# Patient Record
Sex: Male | Born: 1975 | Race: White | Hispanic: No | Marital: Single | Smoking: Current every day smoker
Health system: Southern US, Community
[De-identification: ages and names within clinical notes are randomized; demographics above are authoritative.]

---

## 2014-03-12 ENCOUNTER — Emergency Department (HOSPITAL_COMMUNITY)
Admission: EM | Admit: 2014-03-12 | Discharge: 2014-03-12 | Disposition: A | Discharge status: Home / Self Care | Payer: Self-pay | Attending: Emergency Medicine | Admitting: Emergency Medicine

## 2014-03-12 ENCOUNTER — Emergency Department (HOSPITAL_COMMUNITY): Payer: Self-pay

## 2014-03-12 ENCOUNTER — Encounter (HOSPITAL_COMMUNITY): Payer: Self-pay | Admitting: Emergency Medicine

## 2014-03-12 DIAGNOSIS — IMO0002 Reserved for concepts with insufficient information to code with codable children: Secondary | ICD-10-CM | POA: Insufficient documentation

## 2014-03-12 DIAGNOSIS — F17200 Nicotine dependence, unspecified, uncomplicated: Secondary | ICD-10-CM | POA: Insufficient documentation

## 2014-03-12 DIAGNOSIS — M7040 Prepatellar bursitis, unspecified knee: Secondary | ICD-10-CM | POA: Insufficient documentation

## 2014-03-12 DIAGNOSIS — S8990XA Unspecified injury of unspecified lower leg, initial encounter: Secondary | ICD-10-CM | POA: Insufficient documentation

## 2014-03-12 DIAGNOSIS — Y9389 Activity, other specified: Secondary | ICD-10-CM | POA: Insufficient documentation

## 2014-03-12 DIAGNOSIS — M71161 Other infective bursitis, right knee: Secondary | ICD-10-CM

## 2014-03-12 DIAGNOSIS — S99929A Unspecified injury of unspecified foot, initial encounter: Principal | ICD-10-CM

## 2014-03-12 DIAGNOSIS — Y990 Civilian activity done for income or pay: Secondary | ICD-10-CM | POA: Insufficient documentation

## 2014-03-12 DIAGNOSIS — S99919A Unspecified injury of unspecified ankle, initial encounter: Principal | ICD-10-CM

## 2014-03-12 DIAGNOSIS — X500XXA Overexertion from strenuous movement or load, initial encounter: Secondary | ICD-10-CM | POA: Insufficient documentation

## 2014-03-12 DIAGNOSIS — Y929 Unspecified place or not applicable: Secondary | ICD-10-CM | POA: Insufficient documentation

## 2014-03-12 MED ORDER — CEPHALEXIN 500 MG PO CAPS
500.0000 mg | ORAL_CAPSULE | Freq: Once | ORAL | Status: AC
  Administered 2014-03-12: 500 mg via ORAL
  Filled 2014-03-12: qty 1

## 2014-03-12 MED ORDER — CEPHALEXIN 500 MG PO CAPS: 500.0000 mg | ORAL_CAPSULE | Freq: Four times a day (QID) | ORAL | Status: AC

## 2014-03-12 MED ORDER — SULFAMETHOXAZOLE-TMP DS 800-160 MG PO TABS
1.0000 | ORAL_TABLET | Freq: Once | ORAL | Status: AC
  Administered 2014-03-12: 1 via ORAL
  Filled 2014-03-12: qty 1

## 2014-03-12 MED ORDER — SULFAMETHOXAZOLE-TRIMETHOPRIM 800-160 MG PO TABS: 1.0000 | ORAL_TABLET | Freq: Two times a day (BID) | ORAL | Status: AC

## 2014-03-12 MED ORDER — HYDROCODONE-ACETAMINOPHEN 5-325 MG PO TABS: 1.0000 | ORAL_TABLET | Freq: Four times a day (QID) | ORAL | Status: AC | PRN

## 2014-03-12 MED ORDER — HYDROCODONE-ACETAMINOPHEN 5-325 MG PO TABS
2.0000 | ORAL_TABLET | Freq: Once | ORAL | Status: AC
  Administered 2014-03-12: 2 via ORAL
  Filled 2014-03-12: qty 2

## 2014-03-12 NOTE — ED Provider Notes (Signed)
CSN: [id number]     Arrival date & time 03/12/14  1803 History   First MD Initiated Contact with Patient 03/12/14 1940     Chief Complaint  Patient presents with  . Knee Pain  . Joint Swelling     (Consider location/radiation/quality/duration/timing/severity/associated sxs/prior Treatment) HPI Comments: Hit knee on truck bumper 2 weeks ago. Continued to work afterwards and felt a pop. Knee pain and swelling since. No fevers, vomiting.  Patient is a 38 y.o. male presenting with knee pain. The history is provided by the patient.  Knee Pain Location:  Knee Time since incident:  2 weeks Injury: yes   Mechanism of injury: fall   Fall:    Fall occurred:  From a vehicle (slipped stepping into a truck - landed downwards on his knee)   Height of fall:  From standing   Impact surface:  Hard floor (car bumper)   Point of impact:  Knees   Entrapped after fall: no   Knee location:  R knee Pain details:    Quality:  Aching   Radiates to:  Does not radiate   Severity:  Moderate   Onset quality:  Gradual   Duration:  2 weeks Associated symptoms: no fever     History reviewed. No pertinent past medical history. History reviewed. No pertinent past surgical history. History reviewed. No pertinent family history. History  Substance Use Topics  . Smoking status: Current Every Day Smoker -- 1.00 packs/day    Types: Cigarettes  . Smokeless tobacco: Never Used  . Alcohol Use: Yes     Comment: 12 pack a day    Review of Systems  Constitutional: Negative for fever.  Respiratory: Negative for cough and shortness of breath.   Gastrointestinal: Negative for vomiting and abdominal pain.  All other systems reviewed and are negative.     Allergies  Review of patient's allergies indicates no known allergies.  Home Medications   Prior to Admission medications   Medication Sig Start Date End Date Taking? Authorizing Provider  ibuprofen (ADVIL,MOTRIN) 200 MG tablet Take 600 mg by mouth  every 6 (six) hours as needed (pain).   Yes Historical Provider, MD   BP 121/82  Pulse 98  Temp(Src) 98.8 F (37.1 C) (Oral)  Resp 18  SpO2 97% Physical Exam  Nursing note and vitals reviewed. Constitutional: He is oriented to person, place, and time. He appears well-developed and well-nourished. No distress.  HENT:  Head: Normocephalic and atraumatic.  Mouth/Throat: Oropharynx is clear and moist. No oropharyngeal exudate.  Eyes: EOM are normal. Pupils are equal, round, and reactive to light.  Neck: Normal range of motion. Neck supple.  Cardiovascular: Normal rate and regular rhythm.  Exam reveals no friction rub.   No murmur heard. Pulmonary/Chest: Effort normal and breath sounds normal. No respiratory distress. He has no wheezes. He has no rales.  Abdominal: He exhibits no distension. There is no tenderness. There is no rebound.  Musculoskeletal: He exhibits no edema.       Right knee: He exhibits decreased range of motion, swelling (in calf and ankle and around knee) and erythema (anterior patella). He exhibits no effusion and no ecchymosis. Tenderness (anterior patella) found.  Neurological: He is alert and oriented to person, place, and time.  Skin: He is not diaphoretic.    ED Course  Procedures (including critical care time) Labs Review Labs Reviewed - No data to display  Imaging Review Dg Knee Complete 4 Views Right  03/12/2014   CLINICAL DATA:  Traumatic injury and pain  EXAM: RIGHT KNEE - COMPLETE 4+ VIEW  COMPARISON:  None.  FINDINGS: No acute fracture is noted. There is soft tissue swelling noted anteriorly over the patella consistent with a recent clinical history. No joint effusion is seen  IMPRESSION: No acute bony fracture. Soft tissue swelling is noted consistent with the history.   Electronically Signed   By: Alcide Clever M.D.   On: 03/12/2014 18:59     EKG Interpretation None      MDM   Final diagnoses:  Septic prepatellar bursitis of right knee    79M  presents with R knee pain and swelling. Hit his knee on his truck 2 weeks ago and then felt a pop later that day while working. Has had worsening swelling and redness and pain in his R knee. No fevers, vomiting, nausea, diarrhea.  Xray without fracture, shows anterior swelling. Patient's exam c/w septic bursitis. No effusion, no concern for septic arthritis. Can f/u with Dr. Dion Saucier. Will give Bactrim and Keflex since he has no insurance.   Dagmar Hait, MD 03/13/14 [phone]

## 2014-03-12 NOTE — Discharge Instructions (Signed)
Bursitis °Bursitis is a swelling and soreness (inflammation) of a fluid-filled sac (bursa) that overlies and protects a joint. It can be caused by injury, overuse of the joint, arthritis or infection. The joints most likely to be affected are the elbows, shoulders, hips and knees. °HOME CARE INSTRUCTIONS  °· Apply ice to the affected area for 15-20 minutes each hour while awake for 2 days. Put the ice in a plastic bag and place a towel between the bag of ice and your skin. °· Rest the injured joint as much as possible, but continue to put the joint through a full range of motion, 4 times per day. (The shoulder joint especially becomes rapidly "frozen" if not used.) When the pain lessens, begin normal slow movements and usual activities. °· Only take over-the-counter or prescription medicines for pain, discomfort or fever as directed by your caregiver. °· Your caregiver may recommend draining the bursa and injecting medicine into the bursa. This may help the healing process. °· Follow all instructions for follow-up with your caregiver. This includes any orthopedic referrals, physical therapy and rehabilitation. Any delay in obtaining necessary care could result in a delay or failure of the bursitis to heal and chronic pain. °SEEK IMMEDIATE MEDICAL CARE IF:  °· Your pain increases even during treatment. °· You develop an oral temperature above 102° F (38.9° C) and have heat and inflammation over the involved bursa. °MAKE SURE YOU:  °· Understand these instructions. °· Will watch your condition. °· Will get help right away if you are not doing well or get worse. °Document Released: 08/18/2000 Document Revised: 11/13/2011 Document Reviewed: 07/23/2009 °[employer]® Patient Information ©2015 [employer]. This information is not intended to replace advice given to you by your health care provider. Make sure you discuss any questions you have with your health care provider. ° °

## 2014-03-12 NOTE — ED Notes (Signed)
Patient c/o redness, swelling, and increased pain to the right knee. Patient states he hit his right knee on a truck bumper 2 weeks ago.

## 2015-03-31 IMAGING — CR DG KNEE COMPLETE 4+V*R*
4 series · 4 of 4 positions shown · non-contrast
Comparison: None.

CLINICAL DATA: Traumatic injury and pain

EXAM:
RIGHT KNEE - COMPLETE 4+ VIEW

[t knee ap right]
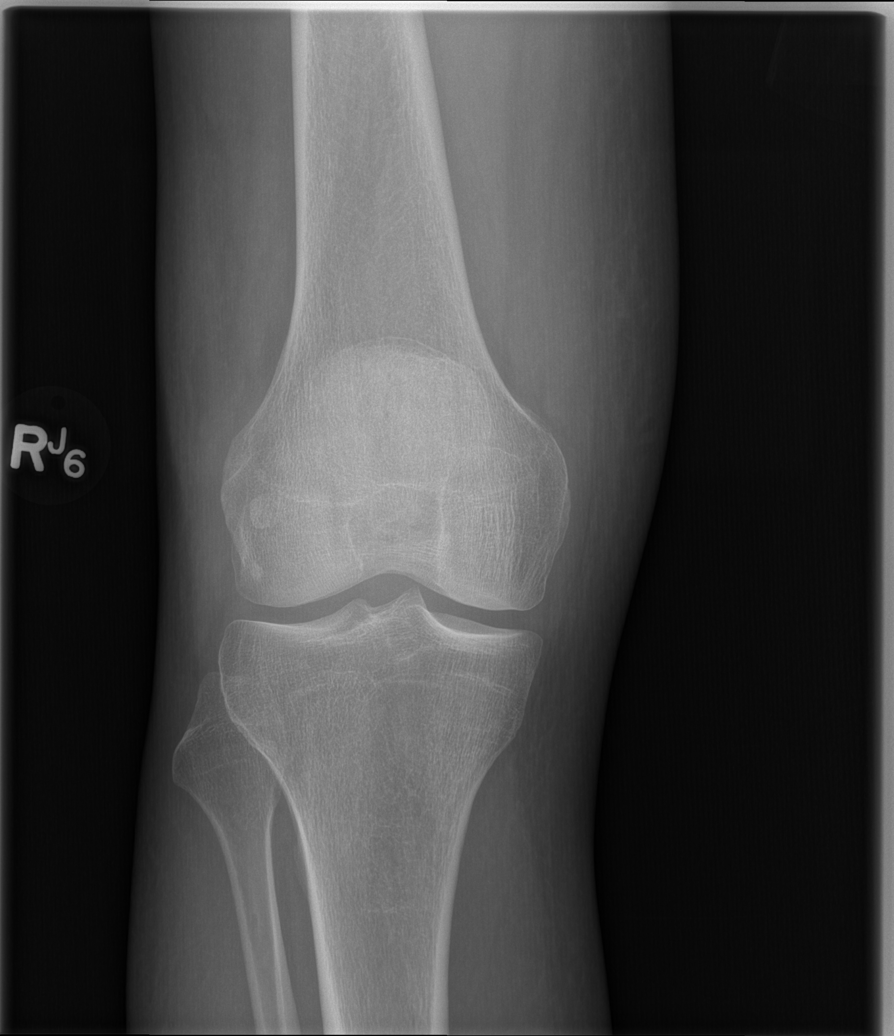

[t knee obl right (1 of 2)]
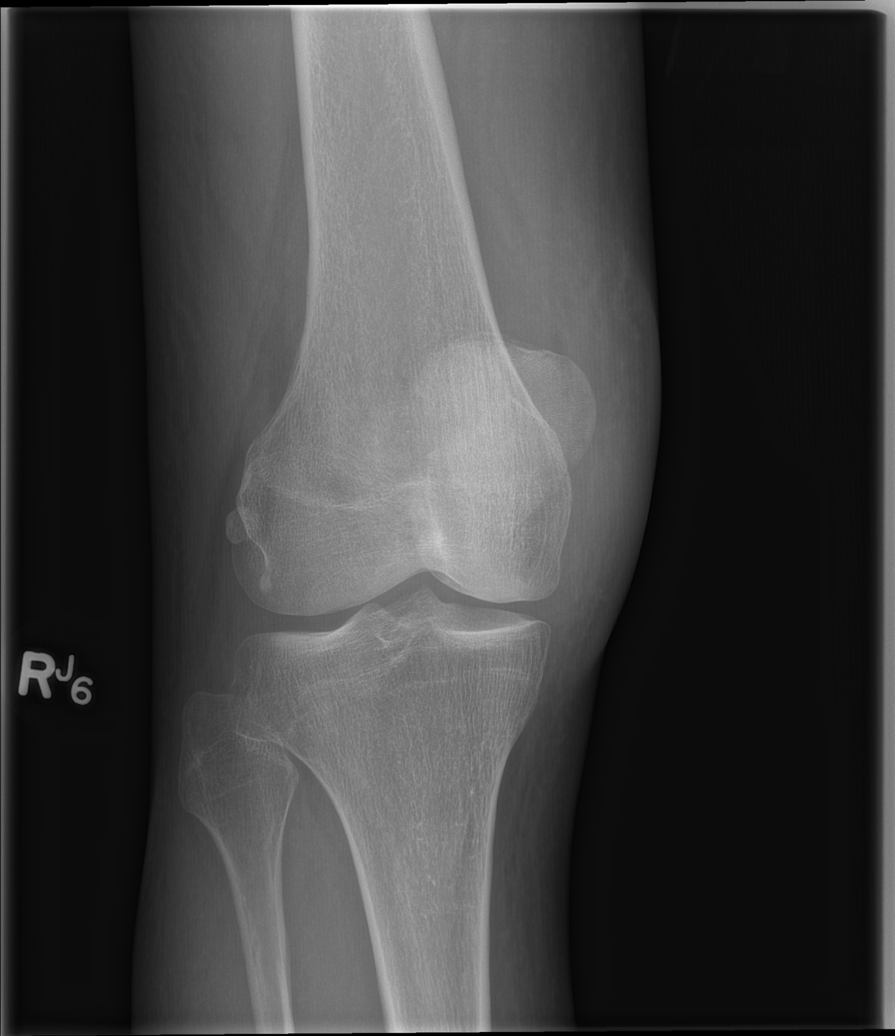

[t knee obl right (2 of 2)]
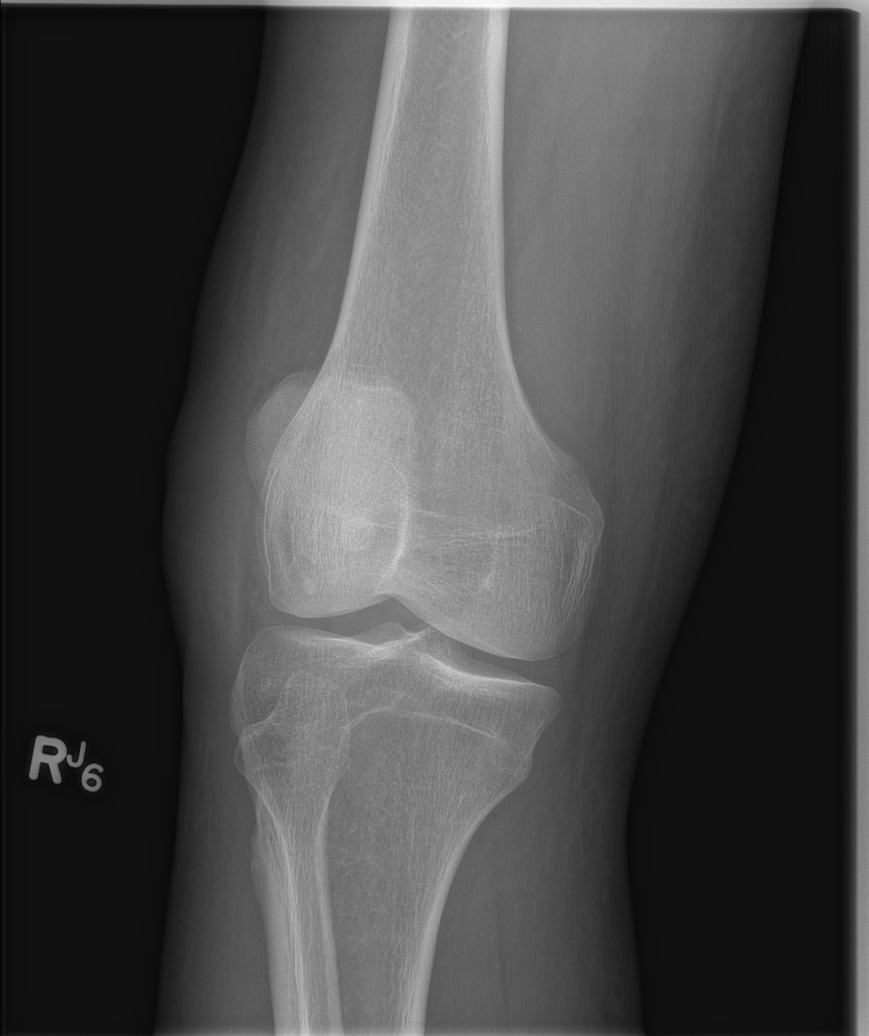

[t knee lat right]
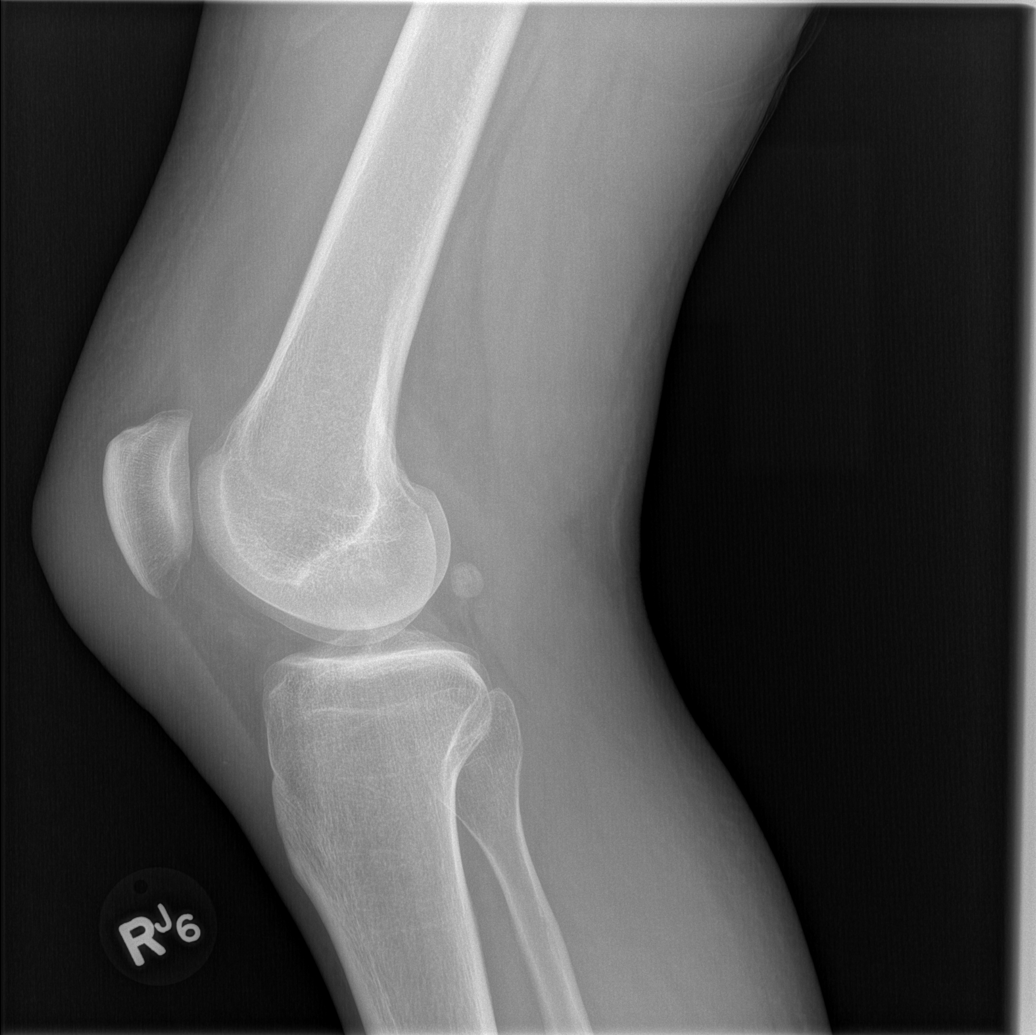

[4 of 4 positions shown; findings below may reference images not displayed]

FINDINGS: No acute fracture is noted. There is soft tissue swelling noted
anteriorly over the patella consistent with a recent clinical
history. No joint effusion is seen
IMPRESSION: No acute bony fracture. Soft tissue swelling is noted consistent
with the history.
